# Patient Record
Sex: Female | Born: 1990 | Race: Black or African American | Hispanic: No | Marital: Single | Smoking: Never smoker
Health system: Southern US, Community
[De-identification: ages and names within clinical notes are randomized; demographics above are authoritative.]

---

## 2010-10-19 ENCOUNTER — Emergency Department (HOSPITAL_COMMUNITY)
Admission: EM | Admit: 2010-10-19 | Discharge: 2010-10-20 | Disposition: A | Discharge status: Home / Self Care | Payer: Self-pay | Attending: Emergency Medicine | Admitting: Emergency Medicine

## 2010-10-19 DIAGNOSIS — R609 Edema, unspecified: Secondary | ICD-10-CM | POA: Insufficient documentation

## 2010-10-19 DIAGNOSIS — M7989 Other specified soft tissue disorders: Secondary | ICD-10-CM | POA: Insufficient documentation

## 2010-10-19 DIAGNOSIS — J45909 Unspecified asthma, uncomplicated: Secondary | ICD-10-CM | POA: Insufficient documentation

## 2010-10-20 ENCOUNTER — Emergency Department (HOSPITAL_COMMUNITY): Payer: Self-pay

## 2010-10-20 LAB — URINE MICROSCOPIC-ADD ON

## 2010-10-20 LAB — URINALYSIS, ROUTINE W REFLEX MICROSCOPIC
Bilirubin Urine: NEGATIVE
Leukocytes, UA: NEGATIVE
Nitrite: NEGATIVE
Specific Gravity, Urine: 1.02 (ref 1.005–1.030)
pH: 5.5 (ref 5.0–8.0)

## 2010-10-20 LAB — POCT I-STAT, CHEM 8
Calcium, Ion: 1.15 mmol/L (ref 1.12–1.32)
Glucose, Bld: 85 mg/dL (ref 70–99)
HCT: 42 % (ref 36.0–46.0)
Hemoglobin: 14.3 g/dL (ref 12.0–15.0)
TCO2: 26 mmol/L (ref 0–100)

## 2010-10-20 LAB — POCT PREGNANCY, URINE: Preg Test, Ur: NEGATIVE

## 2017-02-27 ENCOUNTER — Encounter (HOSPITAL_COMMUNITY): Payer: Self-pay | Admitting: Emergency Medicine

## 2017-02-27 ENCOUNTER — Emergency Department (HOSPITAL_COMMUNITY): Payer: Self-pay

## 2017-02-27 DIAGNOSIS — R202 Paresthesia of skin: Secondary | ICD-10-CM | POA: Insufficient documentation

## 2017-02-27 DIAGNOSIS — R0602 Shortness of breath: Secondary | ICD-10-CM | POA: Insufficient documentation

## 2017-02-27 DIAGNOSIS — R072 Precordial pain: Secondary | ICD-10-CM | POA: Insufficient documentation

## 2017-02-27 DIAGNOSIS — R0789 Other chest pain: Secondary | ICD-10-CM | POA: Insufficient documentation

## 2017-02-27 LAB — I-STAT BETA HCG BLOOD, ED (MC, WL, AP ONLY): I-stat hCG, quantitative: <5 m[IU]/mL (ref <5)

## 2017-02-27 LAB — CBC
HCT: 39.5 % (ref 36.0–46.0)
HEMOGLOBIN: 12.8 g/dL (ref 12.0–15.0)
MCH: 27.5 pg (ref 26.0–34.0)
MCHC: 32.4 g/dL (ref 30.0–36.0)
MCV: 84.8 fL (ref 78.0–100.0)
PLATELETS: 335 10*3/uL (ref 150–400)
RBC: 4.66 MIL/uL (ref 3.87–5.11)
RDW: 14.6 % (ref 11.5–15.5)
WBC: 7.1 10*3/uL (ref 4.0–10.5)

## 2017-02-27 LAB — BASIC METABOLIC PANEL
ANION GAP: 7 (ref 5–15)
BUN: 8 mg/dL (ref 6–20)
CALCIUM: 8.9 mg/dL (ref 8.9–10.3)
CO2: 24 mmol/L (ref 22–32)
Chloride: 109 mmol/L (ref 101–111)
Creatinine, Ser: 0.7 mg/dL (ref 0.44–1.00)
GFR calc Af Amer: >60 mL/min (ref >60)
GLUCOSE: 94 mg/dL (ref 65–99)
Potassium: 3.8 mmol/L (ref 3.5–5.1)
Sodium: 140 mmol/L (ref 135–145)

## 2017-02-27 LAB — I-STAT TROPONIN, ED: TROPONIN I, POC: 0 ng/mL (ref 0.00–0.08)

## 2017-02-27 NOTE — ED Triage Notes (Signed)
Pt from home with c/o central CP that began about 1 hour PTA. Pt states she was driving when pain started. Pt denies heavy lifting, illness, or recent stress. Pt denies hx of htn. Pt denies radiation of pain. Pt denies lightheadedness, nausea, or other accompanying symptoms.

## 2017-02-28 ENCOUNTER — Emergency Department (HOSPITAL_COMMUNITY)
Admission: EM | Admit: 2017-02-28 | Discharge: 2017-02-28 | Disposition: A | Discharge status: Home / Self Care | Payer: Self-pay | Attending: Emergency Medicine | Admitting: Emergency Medicine

## 2017-02-28 DIAGNOSIS — R079 Chest pain, unspecified: Secondary | ICD-10-CM

## 2017-02-28 LAB — I-STAT TROPONIN, ED: Troponin i, poc: 0 ng/mL (ref 0.00–0.08)

## 2017-02-28 MED ORDER — IPRATROPIUM-ALBUTEROL 0.5-2.5 (3) MG/3ML IN SOLN
3.0000 mL | Freq: Once | RESPIRATORY_TRACT | Status: AC
  Administered 2017-02-28: 3 mL via RESPIRATORY_TRACT
  Filled 2017-02-28: qty 3

## 2017-02-28 MED ORDER — KETOROLAC TROMETHAMINE 30 MG/ML IJ SOLN
15.0000 mg | Freq: Once | INTRAMUSCULAR | Status: AC
  Administered 2017-02-28: 15 mg via INTRAMUSCULAR

## 2017-02-28 MED ORDER — KETOROLAC TROMETHAMINE 30 MG/ML IJ SOLN
15.0000 mg | Freq: Once | INTRAMUSCULAR | Status: DC
  Filled 2017-02-28: qty 1

## 2017-02-28 MED ORDER — GI COCKTAIL ~~LOC~~
30.0000 mL | Freq: Once | ORAL | Status: AC
  Administered 2017-02-28: 30 mL via ORAL
  Filled 2017-02-28: qty 30

## 2017-02-28 NOTE — ED Provider Notes (Signed)
[facility]-EMERGENCY DEPT Provider Note   CSN: [id number] Arrival date & time: 02/27/17  2058     History   Chief Complaint Chief Complaint  Patient presents with  . Chest Pain    HPI Cheryl Howard is a 26 y.o. female.  HPI [age] year old Caucasian female past medical history significant for obesity presents to the emergency department today for evaluation of chest pain.  The patient states that her substernal chest pain began approximately 1 hour prior to arrival while she was driving.  Denies any exertional chest pain.  States that it felt like a tightness in her chest.  The pain did not radiate.  It was not associated with diaphoresis, nausea, emesis.  Patient does report some mild shortness of breath which has since resolved.  Denies any associated shortness of breath.  Patient states that she did have some tingling in her left arm.  The pain has since decreased but has not fully subsided.  Patient denies any cardiac history.  Denies any pleuritic chest pain.  She denies any significant family cardiac history patient has a history of diabetes, hypertension, high cholesterol.  She denies any history of PE/DVT, prolonged immobilization, recent hospitalization/surgeries, unilateral leg swelling or calf tenderness, tobacco use, OCP use.  She has taken nothing for pain prior to arrival.  Nothing makes better or worse.  Pt denies any fever, chill, ha, vision changes, lightheadedness, dizziness, congestion, neck pain,cough, abd pain, n/v/d, urinary symptoms, change in bowel habits, melena, hematochezia, lower extremity paresthesias.  History reviewed. No pertinent past medical history.  There are no active problems to display for this patient.   History reviewed. No pertinent surgical history.  OB History    No data available       Home Medications    Prior to Admission medications   Not on File    Family History No family history on file.  Social  History Social History   Tobacco Use  . Smoking status: Never Smoker  Substance Use Topics  . Alcohol use: Yes    Frequency: Never    Comment: socially   . Drug use: No     Allergies   Patient has no known allergies.   Review of Systems Review of Systems  Constitutional: Negative for chills, diaphoresis and fever.  HENT: Negative for congestion.   Eyes: Negative for visual disturbance.  Respiratory: Positive for shortness of breath. Negative for cough.   Cardiovascular: Positive for chest pain. Negative for palpitations and leg swelling.  Gastrointestinal: Negative for abdominal pain, diarrhea, nausea and vomiting.  Genitourinary: Negative for dysuria, flank pain, frequency, hematuria and urgency.  Musculoskeletal: Negative for arthralgias and myalgias.  Skin: Negative for rash.  Neurological: Negative for dizziness, syncope, weakness, light-headedness, numbness and headaches.  Psychiatric/Behavioral: Negative for sleep disturbance. The patient is not nervous/anxious.      Physical Exam Updated Vital Signs BP (!) 140/106 (BP Location: Right Arm)   Pulse 68   Resp 19   Ht 5\' 8"  (1.727 m)   Wt (!) 195 kg (430 lb)   LMP 02/20/2017   SpO2 100%   BMI 65.38 kg/m   Physical Exam  Constitutional: She is oriented to person, place, and time. She appears well-developed and well-nourished.  Non-toxic appearance. No distress.  HENT:  Head: Normocephalic and atraumatic.  Nose: Nose normal.  Mouth/Throat: Oropharynx is clear and moist.  Eyes: Conjunctivae are normal. Pupils are equal, round, and reactive to light. Right eye exhibits no discharge. Left eye  exhibits no discharge.  Neck: Normal range of motion. Neck supple. No JVD present. No tracheal deviation present.  Cardiovascular: Normal rate, regular rhythm, normal heart sounds and intact distal pulses. Exam reveals no gallop and no friction rub.  No murmur heard. Pulmonary/Chest: Effort normal and breath sounds normal. No  stridor. No respiratory distress. She has no wheezes. She has no rales. She exhibits tenderness (anterior chest wall).  No hypoxia or tachypnea.  Abdominal: Soft. Bowel sounds are normal. She exhibits no distension. There is no tenderness. There is no rebound and no guarding.  Musculoskeletal: Normal range of motion.  No lower extremity edema or calf tenderness.  Lymphadenopathy:    She has no cervical adenopathy.  Neurological: She is alert and oriented to person, place, and time.  Skin: Skin is warm and dry. Capillary refill takes less than 2 seconds. She is not diaphoretic.  Psychiatric: Her behavior is normal. Judgment and thought content normal.  Nursing note and vitals reviewed.    ED Treatments / Results  Labs (all labs ordered are listed, but only abnormal results are displayed) Labs Reviewed  BASIC METABOLIC PANEL  CBC  I-STAT TROPONIN, ED  I-STAT BETA HCG BLOOD, ED ([facility], [facility], [facility] ONLY)  I-STAT TROPONIN, ED    EKG  EKG Interpretation  Date/Time:  Friday February 27 2017 21:22:02 EST Ventricular Rate:  73 PR Interval:    QRS Duration: 102 QT Interval:  409 QTC Calculation: 451 R Axis:   46 Text Interpretation:  Sinus rhythm Borderline T abnormalities, diffuse leads Baseline wander in lead(s) V2 V3 diminished t wave amplitude Otherwise no significant change Confirmed by Melene PlanFloyd, Dan [phone]([id number]) on 02/28/2017 1:53:39 AM       Radiology Dg Chest 2 View  Result Date: 02/27/2017 CLINICAL DATA:  Chest pain EXAM: CHEST  2 VIEW COMPARISON:  October 20, 2010 FINDINGS: Lungs are clear. Heart size and pulmonary vascularity are normal. No adenopathy. No pneumothorax. No bone lesions. IMPRESSION: No edema or consolidation. Electronically Signed   By: Bretta BangWilliam  Woodruff III M.D.   On: 02/27/2017 21:58    Procedures Procedures (including critical care time)  Medications Ordered in ED Medications  gi cocktail (Maalox,Lidocaine,Donnatal) (30 mLs Oral Given 02/28/17 0255)    ipratropium-albuterol (DUONEB) 0.5-2.5 (3) MG/3ML nebulizer solution 3 mL (3 mLs Nebulization Given 02/28/17 0257)  ketorolac (TORADOL) 30 MG/ML injection 15 mg (15 mg Intramuscular Given 02/28/17 0324)     Initial Impression / Assessment and Plan / ED Course  I have reviewed the triage vital signs and the nursing notes.  Pertinent labs & imaging results that were available during my care of the patient were reviewed by me and considered in my medical decision making (see chart for details).     Pt presents to the Ed today with complaints of cp. Patient is to be discharged with recommendation to follow up with PCP in regards to today's hospital visit. Chest pain is not likely of cardiac or pulmonary etiology d/t presentation, perc negative, VSS, no tracheal deviation, no JVD or new murmur, RRR, breath sounds equal bilaterally, EKG without any change from prior tracing and shows no signs of ischemia, negative delta troponin, and negative CXR. Heart pathway score 2.  Clinical presentation does not seem consistent with ACS, dissection, PE, pneumonia.  Pt has been advised to return to the ED is CP becomes exertional, associated with diaphoresis or nausea, radiates to left jaw/arm, worsens or becomes concerning in any way. Pt appears reliable for follow up and is  agreeable to discharge.   Final Clinical Impressions(s) / ED Diagnoses   Final diagnoses:  Nonspecific chest pain    ED Discharge Orders    None       Rise Mu, PA-C 02/28/17 0900    Rise Mu, PA-C 02/28/17 0901    Melene Plan, DO 02/28/17 2304

## 2017-02-28 NOTE — Discharge Instructions (Signed)
Your workup has been reassuring in the ED.  Unknown cause of your chest pain.  She follow-up with a primary care doctor for worsening symptoms and.  Return to the ED for chest pain becomes exertional you develop shortness of breath or for any new reasons.

## 2018-10-11 IMAGING — CR DG CHEST 2V
2 series · 2 of 2 positions shown · non-contrast
Comparison: October 20, 2010

CLINICAL DATA: Chest pain

EXAM:
CHEST  2 VIEW

[w chest pa]
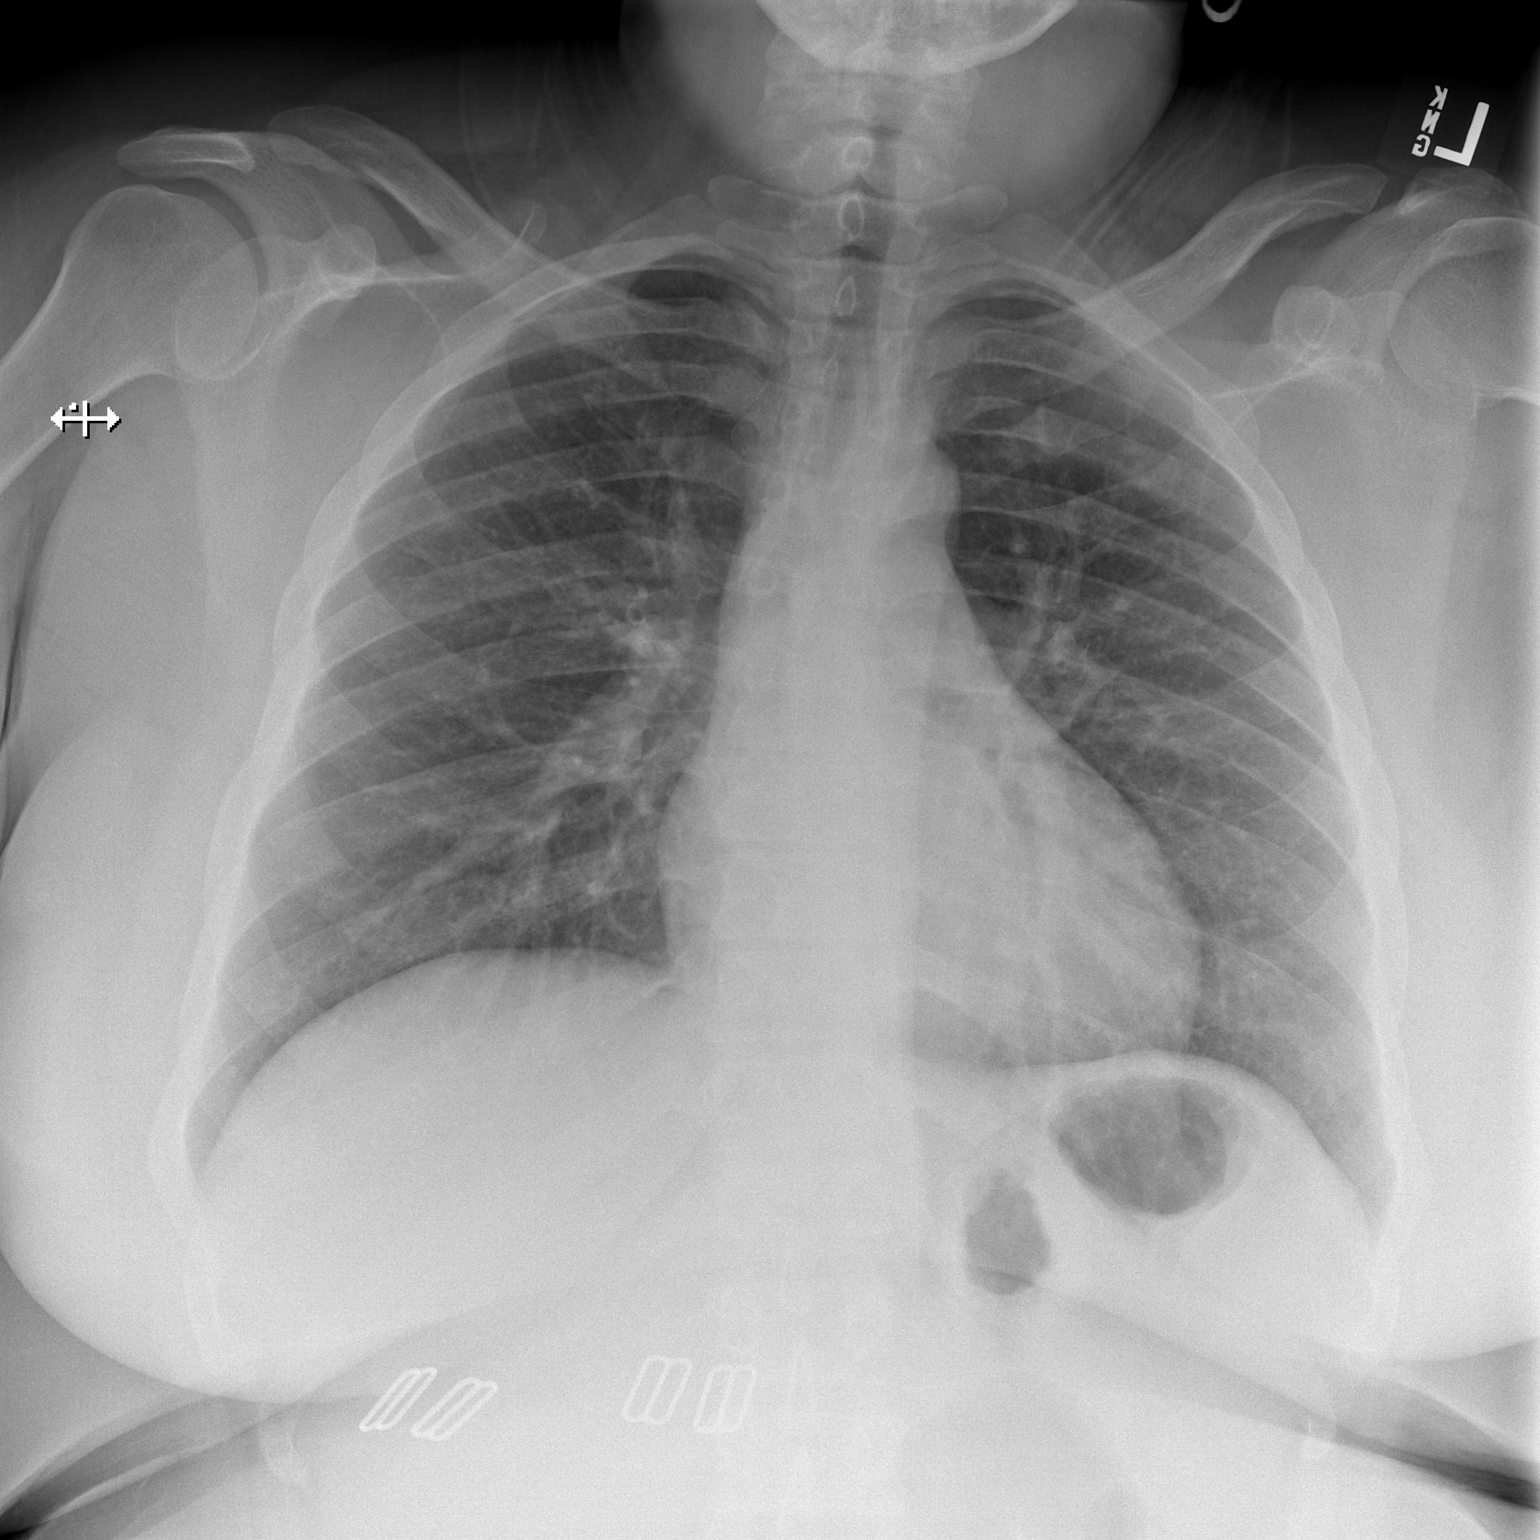

[w chest lat]
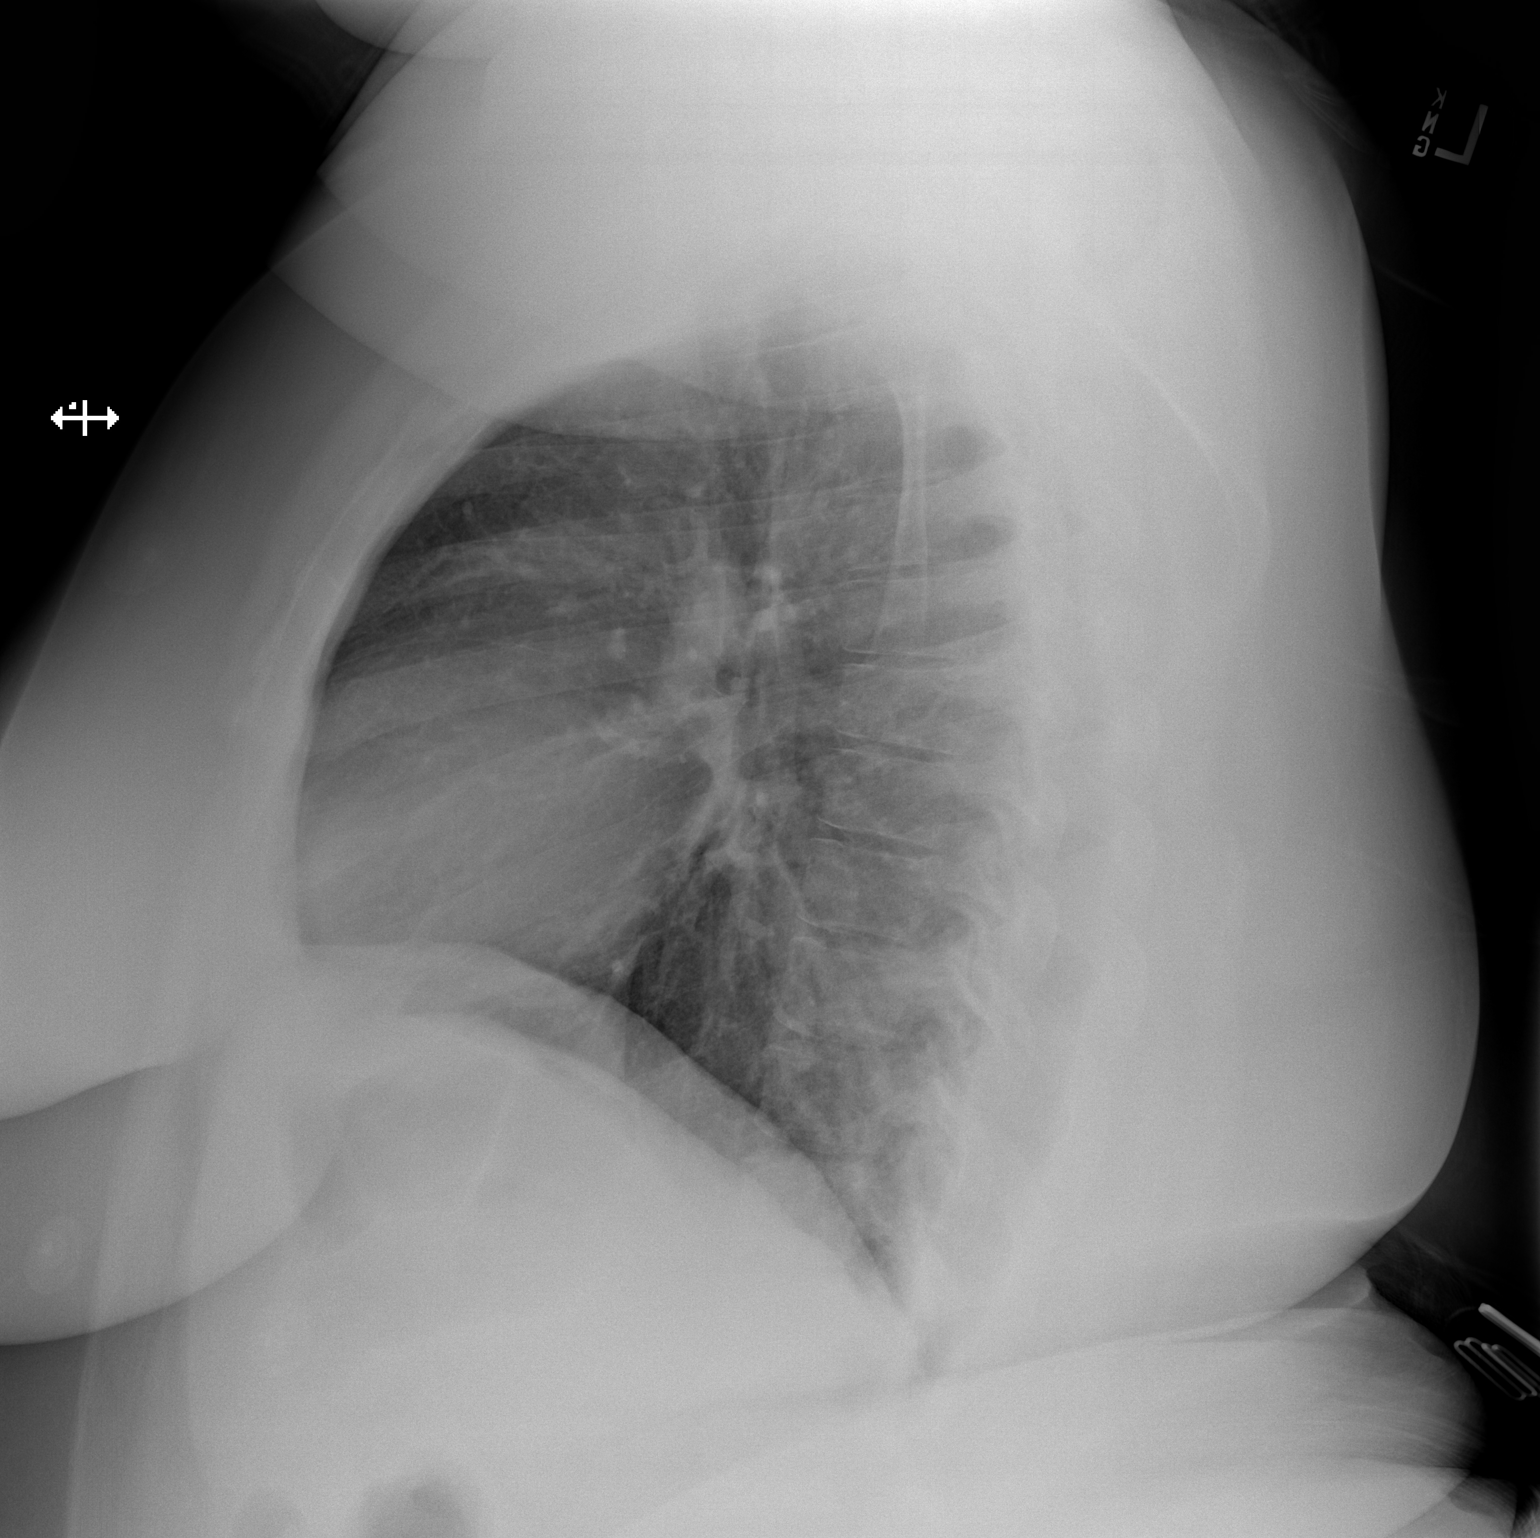

[2 of 2 positions shown; findings below may reference images not displayed]

FINDINGS: Lungs are clear. Heart size and pulmonary vascularity are normal. No
adenopathy. No pneumothorax. No bone lesions.
IMPRESSION: No edema or consolidation.
